# Patient Record
Sex: Male | Born: 1990 | Race: White | Hispanic: No | Marital: Single | Smoking: Never smoker
Health system: Southern US, Community
[De-identification: ages and names within clinical notes are randomized; demographics above are authoritative.]

---

## 2011-12-03 ENCOUNTER — Ambulatory Visit: Payer: Self-pay

## 2020-12-02 ENCOUNTER — Ambulatory Visit: Payer: Self-pay

## 2021-04-07 ENCOUNTER — Other Ambulatory Visit: Payer: Self-pay

## 2021-04-07 ENCOUNTER — Emergency Department (HOSPITAL_COMMUNITY): Payer: Self-pay

## 2021-04-07 ENCOUNTER — Emergency Department (HOSPITAL_COMMUNITY)
Admission: EM | Admit: 2021-04-07 | Discharge: 2021-04-07 | Disposition: A | Discharge status: Home / Self Care | Payer: Self-pay | Attending: Emergency Medicine | Admitting: Emergency Medicine

## 2021-04-07 ENCOUNTER — Encounter (HOSPITAL_COMMUNITY): Payer: Self-pay | Admitting: Emergency Medicine

## 2021-04-07 DIAGNOSIS — Z23 Encounter for immunization: Secondary | ICD-10-CM | POA: Insufficient documentation

## 2021-04-07 DIAGNOSIS — W208XXA Other cause of strike by thrown, projected or falling object, initial encounter: Secondary | ICD-10-CM | POA: Insufficient documentation

## 2021-04-07 DIAGNOSIS — S6721XA Crushing injury of right hand, initial encounter: Secondary | ICD-10-CM | POA: Insufficient documentation

## 2021-04-07 MED ORDER — TETANUS-DIPHTH-ACELL PERTUSSIS 5-2.5-18.5 LF-MCG/0.5 IM SUSY
0.5000 mL | PREFILLED_SYRINGE | Freq: Once | INTRAMUSCULAR | Status: AC
  Administered 2021-04-07: 0.5 mL via INTRAMUSCULAR
  Filled 2021-04-07: qty 0.5

## 2021-04-07 MED ORDER — IBUPROFEN 600 MG PO TABS: 600.0000 mg | ORAL_TABLET | Freq: Four times a day (QID) | ORAL | 0 refills | Status: AC | PRN

## 2021-04-07 NOTE — ED Provider Notes (Signed)
[facility] EMERGENCY DEPARTMENT Provider Note   CSN: [id number] Arrival date & time: 04/07/21  1638     History Chief Complaint  Patient presents with  . Hand Pain    Samuel Calderon is a 30 y.o. male, left handed, presenting with right hand pain after a window fell and landed on his second third and fourth fingers yesterday evening.  He sustained small lacerations on the volar fingers, he endorses significant pain in reports decreased sensation in his long fingertip.  He has cleaned the wounds and applied dressings and the wound edges are well approximated, he does state that with movement one of the wounds breaks open a little bit and bleeds.  He is not current with his tetanus.  HPI     History reviewed. No pertinent past medical history.  There are no problems to display for this patient.   History reviewed. No pertinent surgical history.     History reviewed. No pertinent family history.  Social History   Tobacco Use  . Smoking status: Never Smoker  . Smokeless tobacco: Never Used  Vaping Use  . Vaping Use: Never used    Home Medications Prior to Admission medications   Medication Sig Start Date End Date Taking? Authorizing Provider  ibuprofen (ADVIL) 600 MG tablet Take 1 tablet (600 mg total) by mouth every 6 (six) hours as needed. 04/07/21  Yes IdolRaynelle Fanning, PA-C    Allergies    Sulphamethoxydiazine  Review of Systems   Review of Systems  Constitutional: Negative for fever.  Musculoskeletal: Positive for arthralgias and joint swelling. Negative for myalgias.  Skin: Positive for wound.  Neurological: Positive for numbness. Negative for weakness.  All other systems reviewed and are negative.   Physical Exam Updated Vital Signs BP (!) 142/80 (BP Location: Right Arm)   Pulse 87   Temp 98.5 F (36.9 C) (Oral)   Resp 16   Ht 5\' 8"  (1.727 m)   Wt 59 kg   SpO2 100%   BMI 19.77 kg/m   Physical Exam Constitutional:      Appearance: He is  well-developed.  HENT:     Head: Atraumatic.  Cardiovascular:     Comments: Pulses equal bilaterally Musculoskeletal:        General: Swelling and tenderness present.     Right hand: Swelling present. Decreased range of motion. Decreased sensation. Normal capillary refill. Normal pulse.     Cervical back: Normal range of motion.     Comments: Less than 2-second cap refill in fingertips of the affected hand.  He has a 0.5 cm curved laceration at the PIP joints volar of the right index and long fingers, well approximated and sealed.   Skin:    General: Skin is warm and dry.  Neurological:     Mental Status: He is alert.     Sensory: No sensory deficit.     Deep Tendon Reflexes: Reflexes normal.     ED Results / Procedures / Treatments   Labs (all labs ordered are listed, but only abnormal results are displayed) Labs Reviewed - No data to display  EKG None  Radiology DG Hand Complete Left  Result Date: 04/07/2021 CLINICAL DATA:  Left hand injury, direct trauma EXAM: LEFT HAND - COMPLETE 3+ VIEW COMPARISON:  None. FINDINGS: There is no evidence of fracture or dislocation. There is no evidence of arthropathy or other focal bone abnormality. Soft tissues are unremarkable. IMPRESSION: Negative. Electronically Signed   By: 04/09/2021 MD  On: 04/07/2021 19:35    Procedures Procedures   Medications Ordered in ED Medications  Tdap (BOOSTRIX) injection 0.5 mL (0.5 mLs Intramuscular Given 04/07/21 1949)    ED Course  I have reviewed the triage vital signs and the nursing notes.  Pertinent labs & imaging results that were available during my care of the patient were reviewed by me and considered in my medical decision making (see chart for details).    MDM Rules/Calculators/A&P                          Imaging reviewed and discussed with patient.  No fracture or dislocation.  He can flex and extend all phalanxes on his injured fingers although range of motion is reduced  secondary to swelling.  He has good cap refill in his fingertips, although he has numbness distal to the injury site.  I suspect this is predominantly due to swelling.  He was advised regarding ice, elevation, ibuprofen was prescribed.  He was given a finger splint to protect the injury as well.  He was referred to Dr. Jena Gauss for further evaluation if his symptoms are not significantly improving over the next week.  There is no indication for suturing of his wounds as they are well approximated and sealed.  No sign of infection.  Tetanus was updated. Final Clinical Impression(s) / ED Diagnoses Final diagnoses:  Crushing injury of hand and fingers, right, initial encounter    Rx / DC Orders ED Discharge Orders         Ordered    ibuprofen (ADVIL) 600 MG tablet  Every 6 hours PRN        04/07/21 2039           Victoriano Lain 04/07/21 2043    Bethann Berkshire, MD 04/09/21 [phone]

## 2021-04-07 NOTE — Discharge Instructions (Signed)
Wear the finger splint to protect your fingers and the lacerations as discussed.  Keep these wounds clean and dry.  Ice and elevation will help with pain and swelling as well the ibuprofen prescribed.  As discussed I suspect the numbness should improve as the swelling improves, however if you have not had significant improvement in this symptom over the next week, especially once the swelling is better you will need to see Dr. Jena Gauss for further evaluation of this injury.  Call his office for an appointment if needed.

## 2021-04-07 NOTE — ED Triage Notes (Signed)
Pt injured his left hand last night when a window fell down.

## 2021-06-06 ENCOUNTER — Telehealth: Payer: Self-pay

## 2021-06-06 NOTE — Telephone Encounter (Signed)
Pt girlfriend Dot Lanes Case) contacted [employer] on the behalf of patient by phone to inquire about assisting patient with getting established with a PCP .   Screening interview was conducted regarding program requirements.  Patient was found to complete requirements successfully.     Current Chief Medical Complaint:  Stated the Need to establish care to assist with management medical current/past medical condition. Concerned about elevated BP readings received when self monitoring,  States BP ranges (200's/100's)  and is concerned about lack of managerment with bipolar condition.  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   Stated understanding and call ended

## 2022-02-03 IMAGING — DX DG HAND COMPLETE 3+V*L*
3 series · 3 of 3 positions shown · non-contrast
Comparison: None.

CLINICAL DATA: Left hand injury, direct trauma

EXAM:
LEFT HAND - COMPLETE 3+ VIEW

[hand pa]
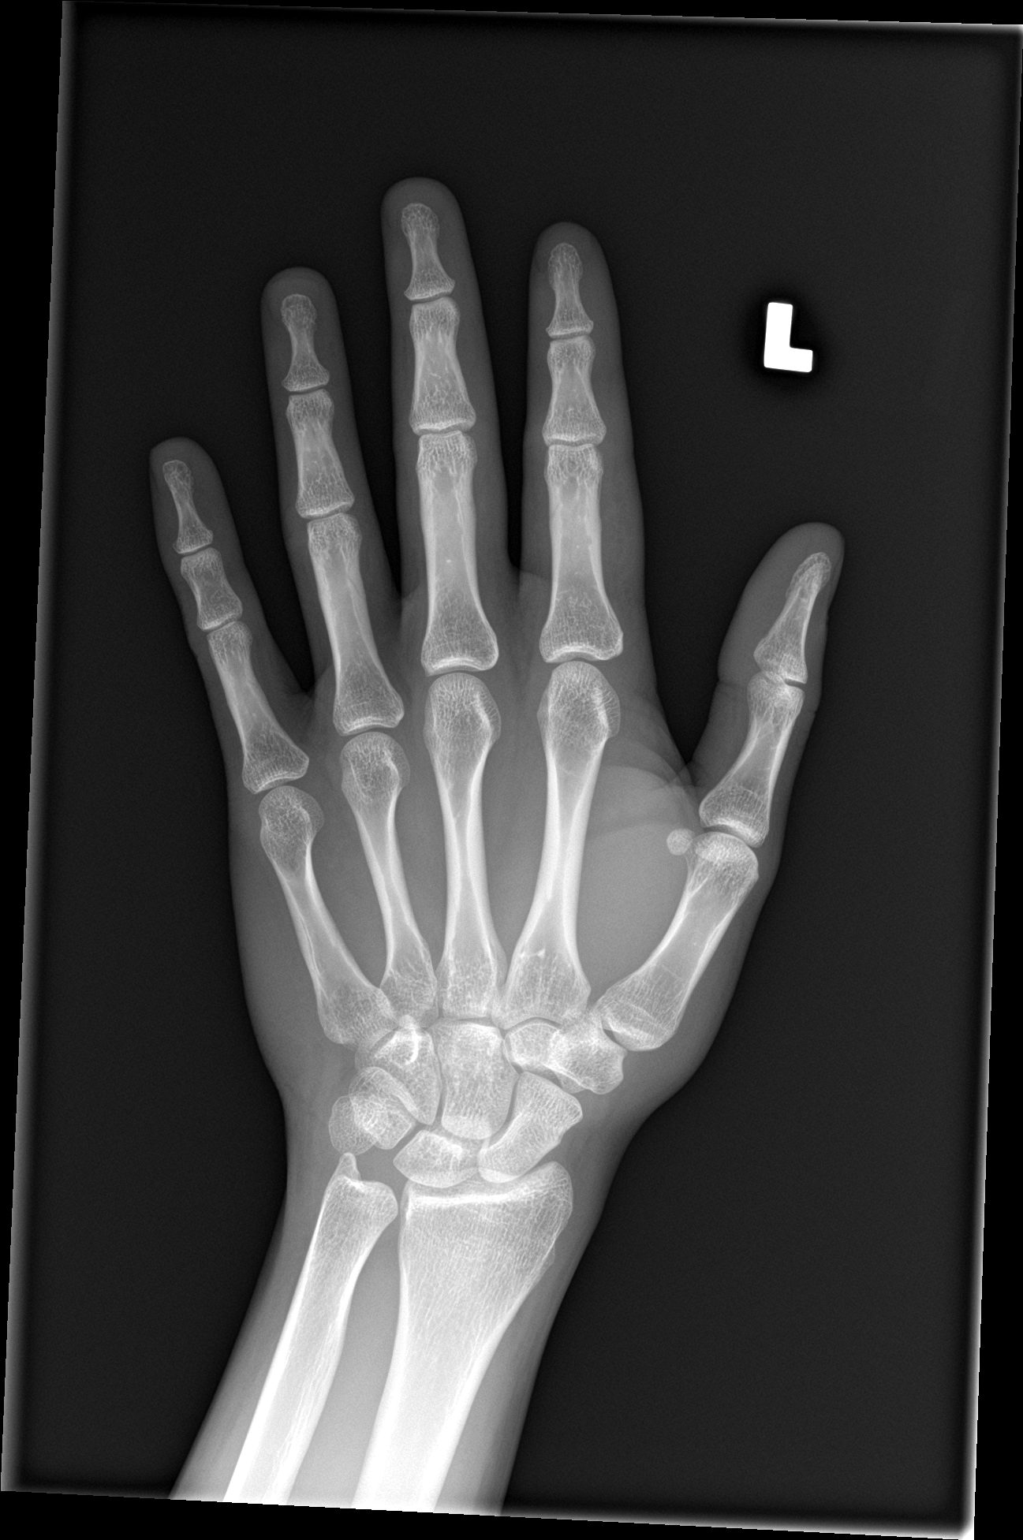

[hand obl]
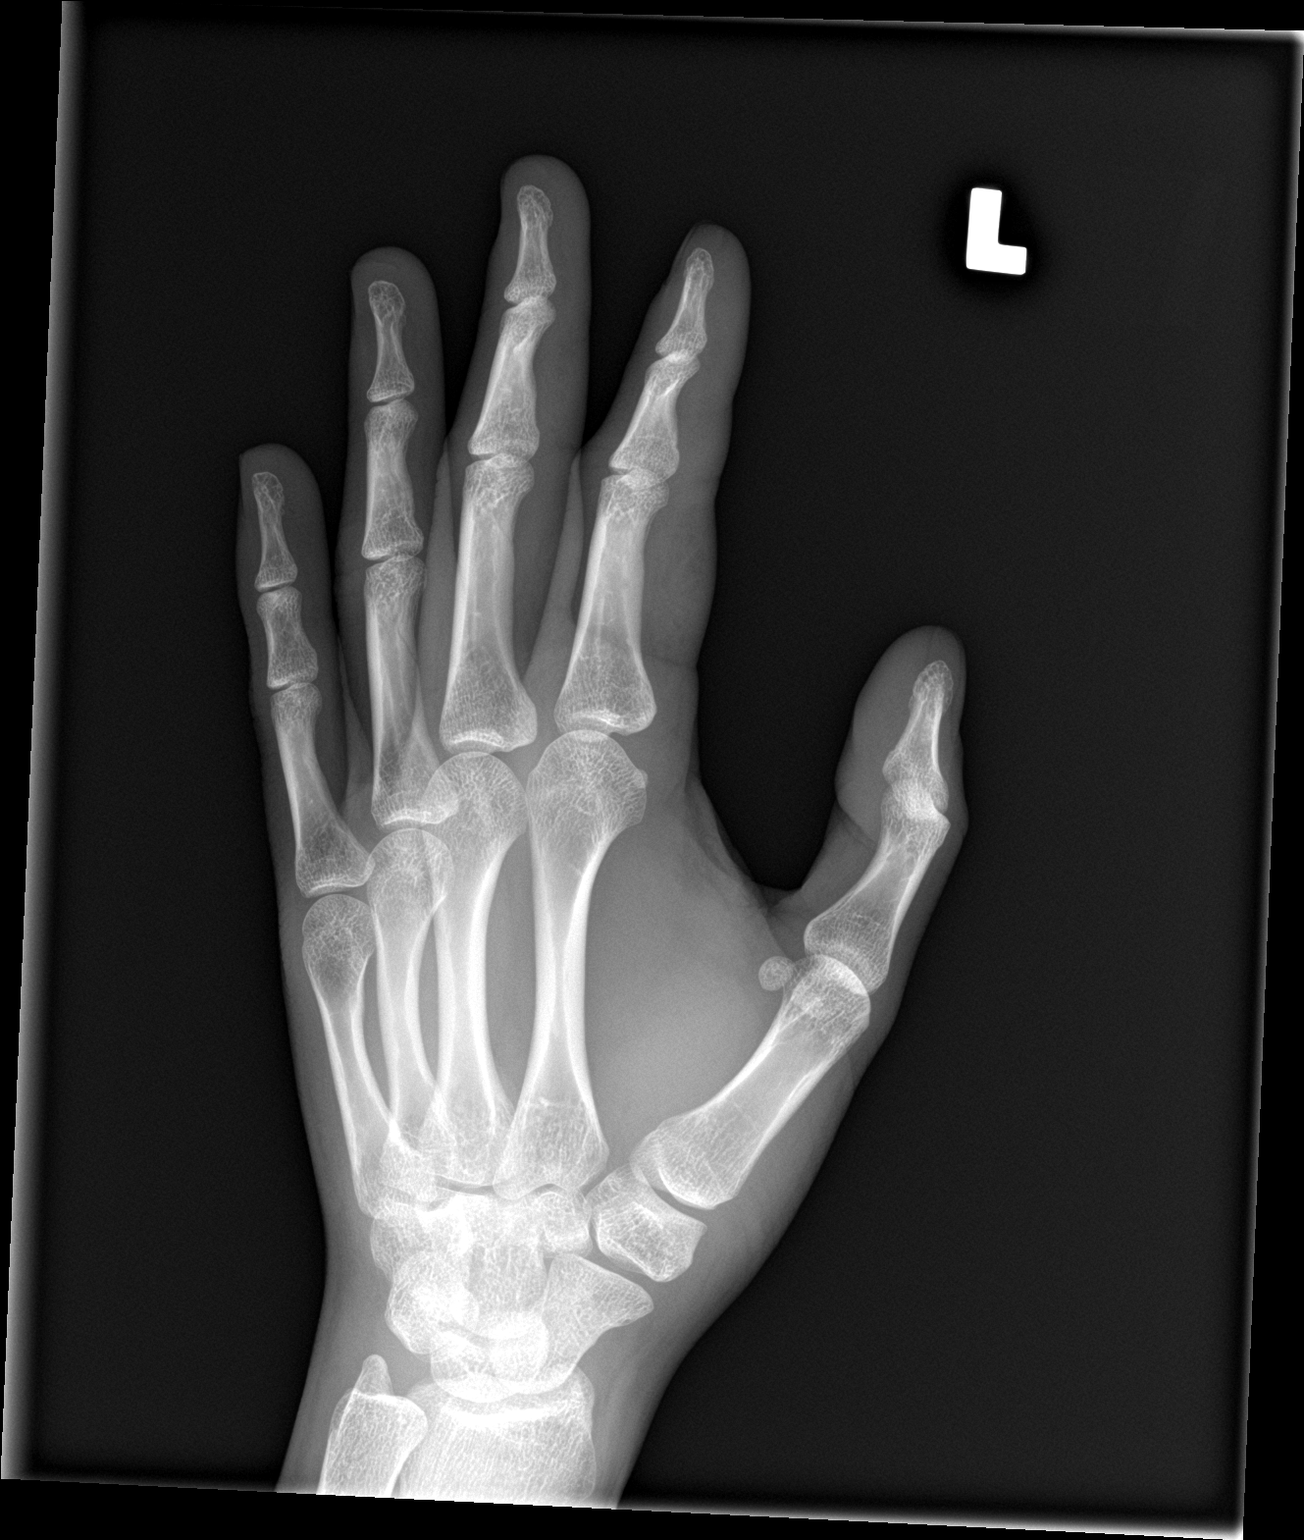

[hand lat]
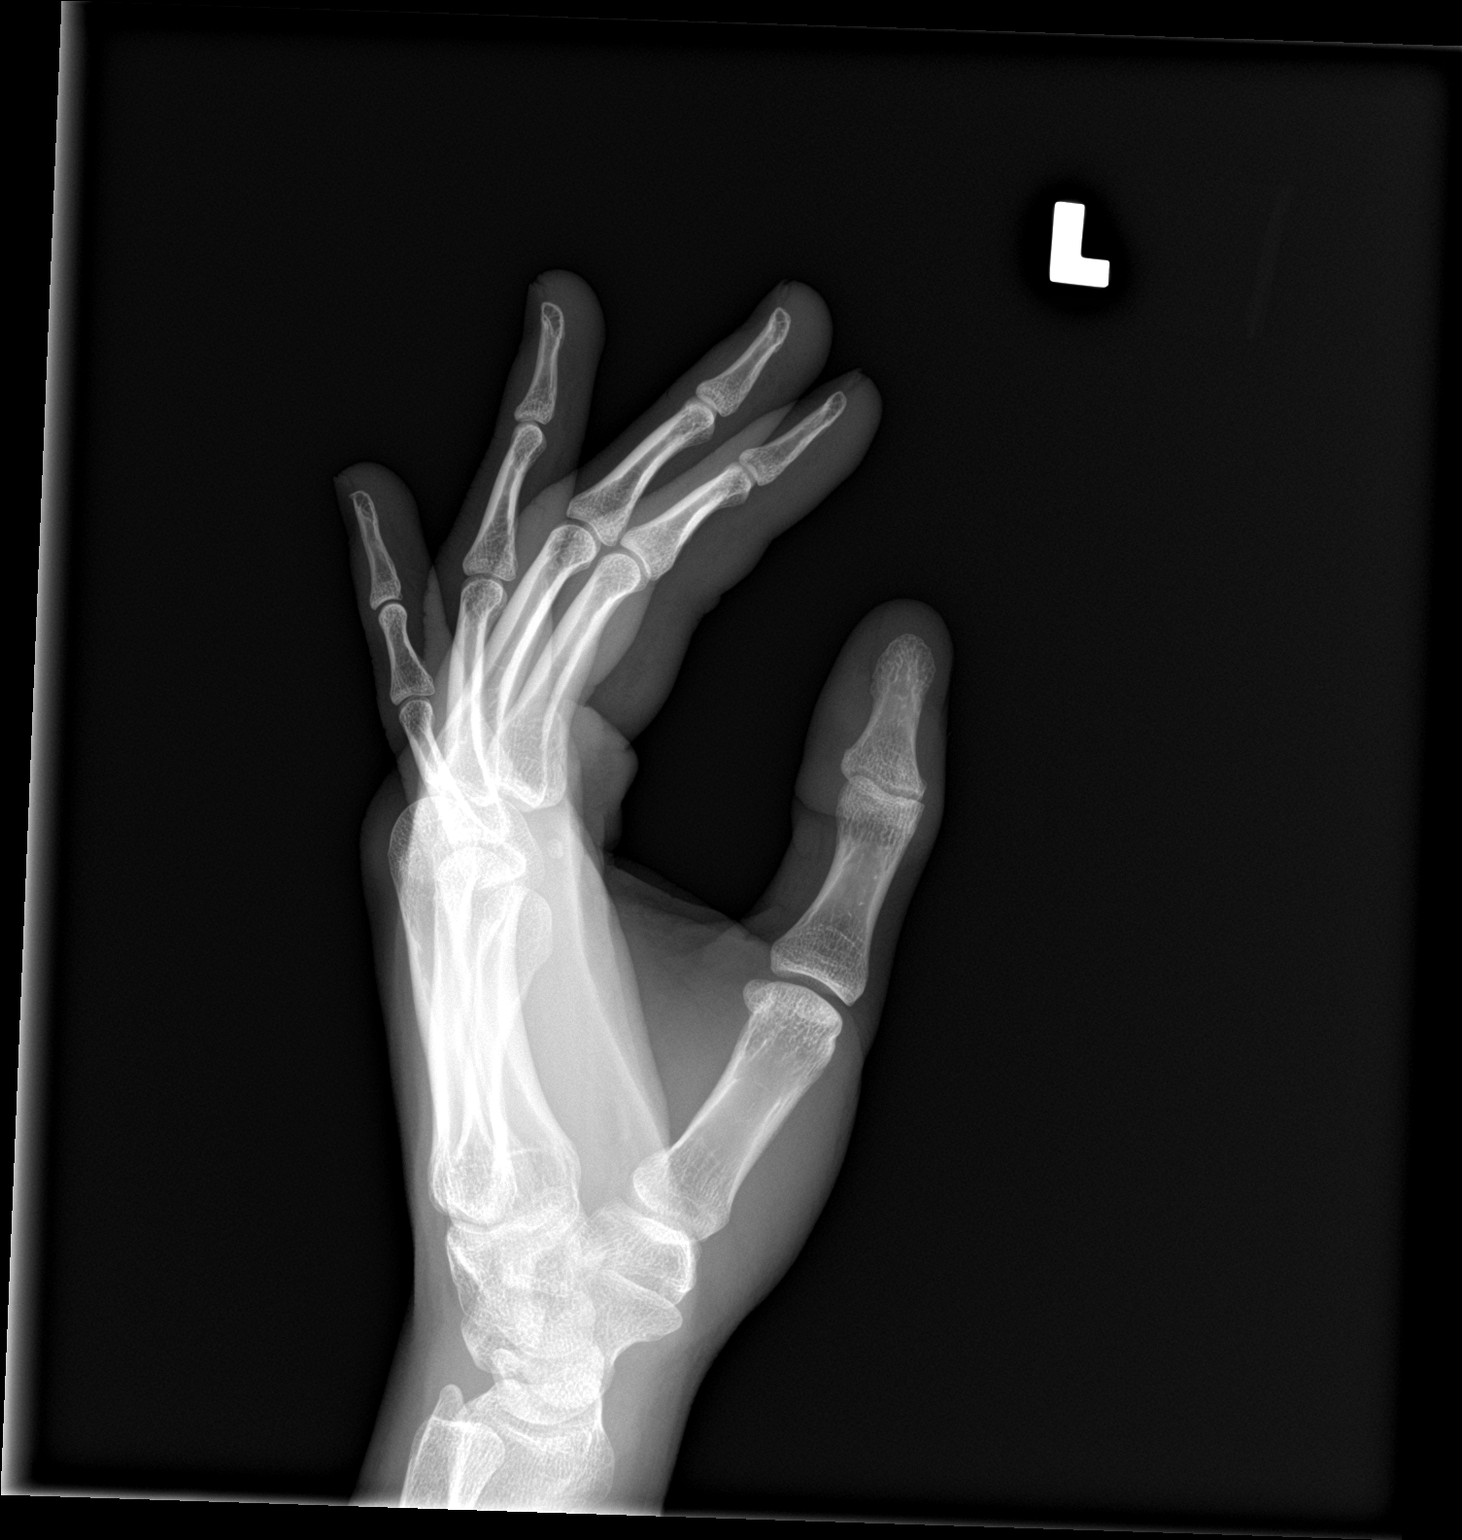

[3 of 3 positions shown; findings below may reference images not displayed]

FINDINGS: There is no evidence of fracture or dislocation. There is no
evidence of arthropathy or other focal bone abnormality. Soft
tissues are unremarkable.
IMPRESSION: Negative.

## 2022-11-02 ENCOUNTER — Emergency Department (HOSPITAL_COMMUNITY)
Admission: EM | Admit: 2022-11-02 | Discharge: 2022-11-02 | Discharge status: Against Medical Advice | Payer: 59 | Attending: Emergency Medicine | Admitting: Emergency Medicine

## 2022-11-02 ENCOUNTER — Other Ambulatory Visit: Payer: Self-pay

## 2022-11-02 ENCOUNTER — Emergency Department (HOSPITAL_COMMUNITY): Payer: 59

## 2022-11-02 ENCOUNTER — Encounter (HOSPITAL_COMMUNITY): Payer: Self-pay

## 2022-11-02 DIAGNOSIS — R0789 Other chest pain: Secondary | ICD-10-CM | POA: Diagnosis present

## 2022-11-02 DIAGNOSIS — Z1152 Encounter for screening for COVID-19: Secondary | ICD-10-CM | POA: Diagnosis not present

## 2022-11-02 DIAGNOSIS — Z5321 Procedure and treatment not carried out due to patient leaving prior to being seen by health care provider: Secondary | ICD-10-CM | POA: Diagnosis not present

## 2022-11-02 DIAGNOSIS — R42 Dizziness and giddiness: Secondary | ICD-10-CM | POA: Diagnosis not present

## 2022-11-02 DIAGNOSIS — R109 Unspecified abdominal pain: Secondary | ICD-10-CM | POA: Diagnosis not present

## 2022-11-02 DIAGNOSIS — R110 Nausea: Secondary | ICD-10-CM | POA: Insufficient documentation

## 2022-11-02 DIAGNOSIS — R0602 Shortness of breath: Secondary | ICD-10-CM | POA: Diagnosis not present

## 2022-11-02 LAB — COMPREHENSIVE METABOLIC PANEL WITH GFR
ALT: 14 U/L (ref 0–44)
AST: 11 U/L — ABNORMAL LOW (ref 15–41)
Albumin: 3.7 g/dL (ref 3.5–5.0)
Alkaline Phosphatase: 58 U/L (ref 38–126)
Anion gap: 7 (ref 5–15)
BUN: 10 mg/dL (ref 6–20)
CO2: 28 mmol/L (ref 22–32)
Calcium: 8.8 mg/dL — ABNORMAL LOW (ref 8.9–10.3)
Chloride: 103 mmol/L (ref 98–111)
Creatinine, Ser: 1.13 mg/dL (ref 0.61–1.24)
GFR, Estimated: >60 mL/min
Glucose, Bld: 117 mg/dL — ABNORMAL HIGH (ref 70–99)
Potassium: 4.4 mmol/L (ref 3.5–5.1)
Sodium: 138 mmol/L (ref 135–145)
Total Bilirubin: 0.2 mg/dL — ABNORMAL LOW (ref 0.3–1.2)
Total Protein: 6.8 g/dL (ref 6.5–8.1)

## 2022-11-02 LAB — CBC WITH DIFFERENTIAL/PLATELET
Abs Immature Granulocytes: 0.01 K/uL (ref 0.00–0.07)
Basophils Absolute: 0 K/uL (ref 0.0–0.1)
Basophils Relative: 1 %
Eosinophils Absolute: 0 K/uL (ref 0.0–0.5)
Eosinophils Relative: 0 %
HCT: 45.3 % (ref 39.0–52.0)
Hemoglobin: 14.9 g/dL (ref 13.0–17.0)
Immature Granulocytes: 0 %
Lymphocytes Relative: 32 %
Lymphs Abs: 1.3 K/uL (ref 0.7–4.0)
MCH: 30.4 pg (ref 26.0–34.0)
MCHC: 32.9 g/dL (ref 30.0–36.0)
MCV: 92.4 fL (ref 80.0–100.0)
Monocytes Absolute: 0.9 K/uL (ref 0.1–1.0)
Monocytes Relative: 23 %
Neutro Abs: 1.8 K/uL (ref 1.7–7.7)
Neutrophils Relative %: 44 %
Platelets: 168 K/uL (ref 150–400)
RBC: 4.9 MIL/uL (ref 4.22–5.81)
RDW: 12.3 % (ref 11.5–15.5)
WBC: 4 K/uL (ref 4.0–10.5)
nRBC: 0 % (ref 0.0–0.2)

## 2022-11-02 LAB — RESP PANEL BY RT-PCR (RSV, FLU A&B, COVID)  RVPGX2
Influenza A by PCR: NEGATIVE
Influenza B by PCR: NEGATIVE
Resp Syncytial Virus by PCR: NEGATIVE
SARS Coronavirus 2 by RT PCR: POSITIVE — AB

## 2022-11-02 LAB — TROPONIN I (HIGH SENSITIVITY): Troponin I (High Sensitivity): <2 ng/L

## 2022-11-02 LAB — LIPASE, BLOOD: Lipase: 30 U/L (ref 11–51)

## 2022-11-02 NOTE — ED Provider Triage Note (Signed)
Emergency Medicine Provider Triage Evaluation Note  Samuel Calderon , a 31 y.o. male  was evaluated in triage.  Pt complains of chest tightness, abdominal pain, nausea, shortness of breath.  States he got sick 3 days ago and has progressively gotten worse.  Was sitting in bed the last 2 days.  Presented today with his significant other who is a patient at this time in the ED.  States he started to get dizzy and lightheaded so he checked himself in.  Has no known sick contacts.  Reports subjective fevers and chills.  Review of Systems  Positive: As above Negative: As above  Physical Exam  BP 103/73 (BP Location: Left Arm)   Pulse 92   Temp 97.9 F (36.6 C) (Oral)   Resp 14   Ht 5\' 7"  (1.702 m)   Wt 56.7 kg   SpO2 96%   BMI 19.58 kg/m  Gen:   Awake, no distress, pale Resp:  Normal effort no adventitious breath sounds MSK:   Moves extremities without difficulty  Other:  Heart regular rate and rhythm  Medical Decision Making  Medically screening exam initiated at 9:05 AM.  Appropriate orders placed.  Calderon was informed that the remainder of the evaluation will be completed by another provider, this initial triage assessment does not replace that evaluation, and the importance of remaining in the ED until their evaluation is complete.     Pollyann Glen, Michelle Piper 11/02/22 [phone]

## 2022-11-02 NOTE — ED Triage Notes (Signed)
Patient states he has been having abdominal pain and having a sore throat. Patient is pale and diaphoretic. Patient's significant other is a patient in the ED.  Patient also reports that he began having chest pain this AM.

## 2023-11-20 ENCOUNTER — Other Ambulatory Visit: Payer: Self-pay

## 2023-11-20 ENCOUNTER — Emergency Department (HOSPITAL_COMMUNITY)
Admission: EM | Admit: 2023-11-20 | Discharge: 2023-11-20 | Disposition: A | Discharge status: Home / Self Care | Payer: 59 | Attending: Emergency Medicine | Admitting: Emergency Medicine

## 2023-11-20 ENCOUNTER — Encounter (HOSPITAL_COMMUNITY): Payer: Self-pay

## 2023-11-20 ENCOUNTER — Emergency Department (HOSPITAL_COMMUNITY): Payer: 59

## 2023-11-20 DIAGNOSIS — R109 Unspecified abdominal pain: Secondary | ICD-10-CM

## 2023-11-20 DIAGNOSIS — R1011 Right upper quadrant pain: Secondary | ICD-10-CM | POA: Diagnosis present

## 2023-11-20 LAB — COMPREHENSIVE METABOLIC PANEL
ALT: 24 U/L (ref 0–44)
AST: 16 U/L (ref 15–41)
Albumin: 3.9 g/dL (ref 3.5–5.0)
Alkaline Phosphatase: 62 U/L (ref 38–126)
Anion gap: 7 (ref 5–15)
BUN: 13 mg/dL (ref 6–20)
CO2: 26 mmol/L (ref 22–32)
Calcium: 9.5 mg/dL (ref 8.9–10.3)
Chloride: 103 mmol/L (ref 98–111)
Creatinine, Ser: 0.83 mg/dL (ref 0.61–1.24)
GFR, Estimated: >60 mL/min (ref >60)
Glucose, Bld: 100 mg/dL — ABNORMAL HIGH (ref 70–99)
Potassium: 4.2 mmol/L (ref 3.5–5.1)
Sodium: 136 mmol/L (ref 135–145)
Total Bilirubin: 0.5 mg/dL (ref 0.0–1.2)
Total Protein: 7.3 g/dL (ref 6.5–8.1)

## 2023-11-20 LAB — URINALYSIS, ROUTINE W REFLEX MICROSCOPIC
Bilirubin Urine: NEGATIVE
Glucose, UA: NEGATIVE mg/dL
Hgb urine dipstick: NEGATIVE
Ketones, ur: NEGATIVE mg/dL
Leukocytes,Ua: NEGATIVE
Nitrite: NEGATIVE
Protein, ur: NEGATIVE mg/dL
Specific Gravity, Urine: 1.006 (ref 1.005–1.030)
pH: 6 (ref 5.0–8.0)

## 2023-11-20 LAB — CBC
HCT: 43.8 % (ref 39.0–52.0)
Hemoglobin: 15 g/dL (ref 13.0–17.0)
MCH: 31.4 pg (ref 26.0–34.0)
MCHC: 34.2 g/dL (ref 30.0–36.0)
MCV: 91.8 fL (ref 80.0–100.0)
Platelets: 243 10*3/uL (ref 150–400)
RBC: 4.77 MIL/uL (ref 4.22–5.81)
RDW: 13.1 % (ref 11.5–15.5)
WBC: 10.7 10*3/uL — ABNORMAL HIGH (ref 4.0–10.5)
nRBC: 0 % (ref 0.0–0.2)

## 2023-11-20 LAB — LIPASE, BLOOD: Lipase: 26 U/L (ref 11–51)

## 2023-11-20 NOTE — ED Triage Notes (Signed)
 Pt stated that he has been having RLQ pain and vomiting since this morning.

## 2023-11-20 NOTE — ED Notes (Signed)
 Pt ambulated to ED room. There have not been any episodes of vomiting since being in the room.

## 2023-11-20 NOTE — Discharge Instructions (Signed)
 Thank you for coming to [facility] Emergency Department. You were seen for abdominal pain. We did an exam, labs, and ultrasound imaging, and these showed no acute findings. You were offered a CT abdomen pelvis scan which you declined.  You can take Tylenol and ibuprofen  for pain control.  If your pain returns or gets worse, or if you are so nauseous and vomiting so much that you cannot eat or drink anything, please return immediately to the emergency department. Please follow up with your primary care provider within 1 week.  You can call health connect to establish with a primary care physician if you do not have 1.  Do not hesitate to return to the ED or call 911 if you experience: -Worsening symptoms -Lightheadedness, passing out -Fevers/chills -Anything else that concerns you

## 2023-11-20 NOTE — ED Provider Notes (Signed)
 [place] EMERGENCY DEPARTMENT AT [facility] Provider Note   CSN: [id number] Arrival date & time: 11/20/23  1158     History  Chief Complaint  Patient presents with   Abdominal Pain    Samuel Calderon is a 33 y.o. male with PMH as listed below who presents with intermittent non-radiating right-sided abdominal pain and nausea/vomiting since this morning. Seems to localize to RUQ. No h/o similar, no h/o abdominal surgeries. Felt feverish this morning but not now. No urinary sxs, diarrhea/constipation, testicular pain, abdominal trauma, hematochezia/melena. Hasn't eaten anything today. Last BM was this AM and was normal. Doesn't notice any ameliorating/aggravating factors.    History reviewed. No pertinent past medical history.     Home Medications Prior to Admission medications   Medication Sig Start Date End Date Taking? Authorizing Provider  ibuprofen  (ADVIL ) 600 MG tablet Take 1 tablet (600 mg total) by mouth every 6 (six) hours as needed. 04/07/21   Idol, Julie, PA-C      Allergies    Sulphamethoxydiazine    Review of Systems   Review of Systems A 10 point review of systems was performed and is negative unless otherwise reported in HPI.  Physical Exam Updated Vital Signs BP 126/77   Pulse 97   Temp 98.5 F (36.9 C) (Oral)   Ht 5' 8 (1.727 m)   Wt 59 kg   SpO2 99%   BMI 19.77 kg/m  Physical Exam General: Normal appearing male, lying in bed.  HEENT: Sclera anicteric, MMM, trachea midline.  Cardiology: RRR, no murmurs/rubs/gallops. BL radial and DP pulses equal bilaterally.  Resp: Normal respiratory rate and effort. CTAB, no wheezes, rhonchi, crackles.  Abd: Soft, TTP in RUQ, negative murphy's sign. non-distended. No TTP in RLQ or at mcburny's point. No rebound tenderness or guarding.  GU: Deferred. MSK: No peripheral edema or signs of trauma. Extremities without deformity or TTP. No cyanosis or clubbing. Skin: warm, dry.  Back: No CVA  tenderness Neuro: A&Ox4, CNs Calderon-XII grossly intact. MAEs. Sensation grossly intact.  Psych: Normal mood and affect.   ED Results / Procedures / Treatments   Labs (all labs ordered are listed, but only abnormal results are displayed) Labs Reviewed  COMPREHENSIVE METABOLIC PANEL - Abnormal; Notable for the following components:      Result Value   Glucose, Bld 100 (*)    All other components within normal limits  CBC - Abnormal; Notable for the following components:   WBC 10.7 (*)    All other components within normal limits  LIPASE, BLOOD  URINALYSIS, ROUTINE W REFLEX MICROSCOPIC    EKG None  Radiology US  Abdomen Limited RUQ (LIVER/GB) Result Date: 11/20/2023 CLINICAL DATA:  Right upper quadrant abdominal pain EXAM: ULTRASOUND ABDOMEN LIMITED RIGHT UPPER QUADRANT COMPARISON:  None Available. FINDINGS: Gallbladder: No gallstones or wall thickening visualized. No sonographic Murphy sign noted by sonographer. Common bile duct: Diameter: 3 mm Liver: No focal lesion identified. Within normal limits in parenchymal echogenicity. Portal vein is patent on color Doppler imaging with normal direction of blood flow towards the liver. Other: None. IMPRESSION: No gallstones or ductal dilatation. Electronically Signed   By: Ranell Bring M.D.   On: 11/20/2023 17:05    Procedures Procedures    Medications Ordered in ED Medications - No data to display  ED Course/ Medical Decision Making/ A&P  Medical Decision Making Amount and/or Complexity of Data Reviewed Labs: ordered. Radiology: ordered. Decision-making details documented in ED Course.    This patient presents to the ED for concern of colicky RUQ abd pain, this involves an extensive number of treatment options, and is a complaint that carries with it a high risk of complications and morbidity.  I considered the following differential and admission for this acute, potentially life threatening condition.   MDM:     For DDX for abdominal pain includes but is not limited to:  Abdominal exam without peritoneal signs. No evidence of acute abdomen at this time. Low suspicion for acute hepatobiliary disease (including acute cholecystitis or cholangitis). Lower c/f acute pancreatitis (neg lipase), PUD (including gastric perforation), bowel obstruction, viscus perforation, or diverticulitis. Wioll begin eval w/ RUQ US . If negative can also consider appendicitis or ureterolithiasis.    Clinical Course as of 11/20/23 1757  [place] Nov 20, 2023  1719 US  Abdomen Limited RUQ (LIVER/GB) No gallstones or ductal dilatation. [HN]  1719 Neg lipase, unremarkable labs. Neg US . Will get CT. [HN]  1756 Discussed with patient who would like to forego the CT scan.  He states he feels better and does not have significant pain at this time, does not want to wait for CT scan. It is possible he had a gallstone that passed. I discussed with patient that it is also possible he has another intra-abdominal process that would not be found on the ultrasound including but not limited to appendicitis, ureterolithiasis, and he reports understanding.  He and his girlfriend at bedside states that they will return to the ED if his symptoms get worse.  He is offered medicine for nausea at home which he also declines.  Given discharge instructions and return precautions, all questions answered to their satisfaction. [HN]    Clinical Course User Index [HN] Franklyn Sid SAILOR, MD    Labs: I Ordered, and personally interpreted labs.  The pertinent results include:  those listed above  Imaging Studies ordered: I ordered imaging studies including RUQ US  I independently visualized and interpreted imaging. I agree with the radiologist interpretation  Additional history obtained from chart review, GF at bedside.   Reevaluation: After the interventions noted above, I reevaluated the patient and found that they have :resolved  Social Determinants of  Health: Lives independently  Disposition:  DC  Co morbidities that complicate the patient evaluation History reviewed. No pertinent past medical history.   Medicines No orders of the defined types were placed in this encounter.   I have reviewed the patients home medicines and have made adjustments as needed  Problem List / ED Course: Problem List Items Addressed This Visit   None Visit Diagnoses       Right sided abdominal pain    -  Primary                   This note was created using dictation software, which may contain spelling or grammatical errors.    Franklyn Sid SAILOR, MD 11/20/23 [phone]
